# Patient Record
Sex: Female | Born: 1955
Health system: Southern US, Community
[De-identification: ages and names within clinical notes are randomized; demographics above are authoritative.]

---

## 2021-06-08 ENCOUNTER — Other Ambulatory Visit: Payer: Self-pay

## 2021-06-08 ENCOUNTER — Encounter: Payer: Self-pay | Admitting: Internal Medicine

## 2021-06-08 ENCOUNTER — Ambulatory Visit
Admission: RE | Admit: 2021-06-08 | Discharge: 2021-06-08 | Disposition: A | Discharge status: Home / Self Care | Payer: Medicare HMO | Attending: Internal Medicine | Admitting: Internal Medicine

## 2021-06-08 ENCOUNTER — Ambulatory Visit (INDEPENDENT_AMBULATORY_CARE_PROVIDER_SITE_OTHER): Payer: Medicare HMO | Admitting: Internal Medicine

## 2021-06-08 ENCOUNTER — Ambulatory Visit
Admission: RE | Admit: 2021-06-08 | Discharge: 2021-06-08 | Disposition: A | Discharge status: Home / Self Care | Payer: Medicare HMO | Source: Ambulatory Visit | Attending: Internal Medicine | Admitting: Internal Medicine

## 2021-06-08 VITALS — BP 140/88 | HR 65 | Temp 97.8°F | Ht 62.09 in | Wt 165.2 lb

## 2021-06-08 DIAGNOSIS — G8929 Other chronic pain: Secondary | ICD-10-CM

## 2021-06-08 DIAGNOSIS — Z13220 Encounter for screening for lipoid disorders: Secondary | ICD-10-CM | POA: Insufficient documentation

## 2021-06-08 DIAGNOSIS — M5441 Lumbago with sciatica, right side: Secondary | ICD-10-CM

## 2021-06-08 DIAGNOSIS — M5442 Lumbago with sciatica, left side: Secondary | ICD-10-CM | POA: Diagnosis not present

## 2021-06-08 DIAGNOSIS — M545 Low back pain, unspecified: Secondary | ICD-10-CM | POA: Diagnosis not present

## 2021-06-08 DIAGNOSIS — Z87898 Personal history of other specified conditions: Secondary | ICD-10-CM | POA: Diagnosis not present

## 2021-06-08 DIAGNOSIS — M791 Myalgia, unspecified site: Secondary | ICD-10-CM | POA: Diagnosis not present

## 2021-06-08 LAB — MICROSCOPIC EXAMINATION
Bacteria, UA: NONE SEEN
Epithelial Cells (non renal): NONE SEEN /hpf (ref 0–10)
WBC, UA: NONE SEEN /hpf (ref 0–5)

## 2021-06-08 LAB — URINALYSIS, ROUTINE W REFLEX MICROSCOPIC
Bilirubin, UA: NEGATIVE
Glucose, UA: NEGATIVE
Ketones, UA: NEGATIVE
Leukocytes,UA: NEGATIVE
Nitrite, UA: NEGATIVE
Protein,UA: NEGATIVE
Specific Gravity, UA: 1.02 (ref 1.005–1.030)
Urobilinogen, Ur: 0.2 mg/dL (ref 0.2–1.0)
pH, UA: 7 (ref 5.0–7.5)

## 2021-06-08 NOTE — Progress Notes (Signed)
BP 140/88   Pulse 65   Temp 97.8 F (36.6 C) (Oral)   Ht 5' 2.09" (1.577 m)   Wt 165 lb 3.2 oz (74.9 kg)   LMP  (LMP Unknown)   SpO2 97%   BMI 30.13 kg/m    Subjective:    Patient ID: Virginia Cruz, female    DOB: [birth year], 65 y.o.   MRN: [id number]  Chief Complaint  Patient presents with  . New Patient (Initial Visit)    To est. Care.  Patient states that she has had muscle pain for the past 20 years.     HPI: Virginia Cruz is a 65 y.o. female  Pt is here from [place], moved from [place].  Pt has no PMH, has some muscle aches and pain every part of her body per her verbal record x has had this for about 20 years now. Was rx a med cannot remember name.  C/o pain , cannot walk well per her sec to pain per her. No ho smoking. No ho PAD.  Co pain when she walks, not sure if work up was done for pad.   Back Pain This is a chronic (fell down at work a long time ago, used to be a Engineer, civil (consulting) aid.) problem. The problem has been gradually worsening since onset. The pain is present in the lumbar spine. The quality of the pain is described as shooting. Radiates to: right leg worse. Pertinent negatives include no abdominal pain, bladder incontinence, bowel incontinence, chest pain, dysuria, fever, headaches, leg pain, numbness, paresis, paresthesias, pelvic pain, perianal numbness, tingling, weakness or weight loss.   Chief Complaint  Patient presents with  . New Patient (Initial Visit)    To est. Care.  Patient states that she has had muscle pain for the past 20 years.     Relevant past medical, surgical, family and social history reviewed and updated as indicated. Interim medical history since our last visit reviewed. Allergies and medications reviewed and updated.  Review of Systems  Constitutional:  Negative for fever and weight loss.  Cardiovascular:  Negative for chest pain.  Gastrointestinal:  Negative for abdominal pain and bowel incontinence.  Genitourinary:  Negative for  bladder incontinence, dysuria and pelvic pain.  Musculoskeletal:  Positive for back pain.  Neurological:  Negative for tingling, weakness, numbness, headaches and paresthesias.   Per HPI unless specifically indicated above     Objective:    BP 140/88   Pulse 65   Temp 97.8 F (36.6 C) (Oral)   Ht 5' 2.09" (1.577 m)   Wt 165 lb 3.2 oz (74.9 kg)   LMP  (LMP Unknown)   SpO2 97%   BMI 30.13 kg/m   Wt Readings from Last 3 Encounters:  06/08/21 165 lb 3.2 oz (74.9 kg)    Physical Exam Vitals and nursing note reviewed.  Constitutional:      General: She is not in acute distress.    Appearance: Normal appearance. She is not ill-appearing or diaphoretic.  Eyes:     Conjunctiva/sclera: Conjunctivae normal.  Cardiovascular:     Rate and Rhythm: Tachycardia present.     Heart sounds: No murmur heard.   No friction rub. No gallop.  Pulmonary:     Effort: No respiratory distress.     Breath sounds: No stridor. No rhonchi or rales.  Chest:     Chest wall: No tenderness.  Abdominal:     General: Abdomen is flat. Bowel sounds are normal. There is no distension.  Palpations: Abdomen is soft. There is no mass.     Tenderness: There is no abdominal tenderness. There is no guarding.     Hernia: No hernia is present.  Musculoskeletal:        General: No swelling, tenderness, deformity or signs of injury.     Right lower leg: No edema.     Left lower leg: No edema.  Skin:    General: Skin is warm and dry.     Coloration: Skin is not jaundiced or pale.     Findings: No bruising, erythema or lesion.  Neurological:     General: No focal deficit present.     Mental Status: She is alert and oriented to person, place, and time.     Coordination: Coordination normal.     Deep Tendon Reflexes: Reflexes normal.     Comments:  Bil dorsalis pedis pulsations wnl  Diminished post tibial pulsaitons.    No results found for this or any previous visit.     No current outpatient medications  on file.    Assessment & Plan:  Back pain : chronic , abnl gait.  Check L spine xray today , consider MRI of such  Refer to PT   2. Muscle pain ? Intermittent claudications? Will need ABI /  MRA of the lower ext  Chronic pain in lower ext muscles including the calves worse with walking.  Will refer to vascualr surg for such  Check labs today   Problem List Items Addressed This Visit       Nervous and Auditory   Chronic low back pain with bilateral sciatica - Primary   Relevant Orders   DG Lumbar Spine Complete   CBC with Differential/Platelet   Comprehensive metabolic panel   Urinalysis, Routine w reflex microscopic   TSH     Other   H/O pain when walking   Relevant Orders   Ambulatory referral to Vascular Surgery   CBC with Differential/Platelet   Comprehensive metabolic panel   Urinalysis, Routine w reflex microscopic   TSH   Screening for lipid disorders   Relevant Orders   Lipid panel   Other Visit Diagnoses     Generalized muscle ache       Relevant Orders   Ambulatory referral to Rheumatology        Orders Placed This Encounter  Procedures  . DG Lumbar Spine Complete  . CBC with Differential/Platelet  . Comprehensive metabolic panel  . Lipid panel  . Urinalysis, Routine w reflex microscopic  . TSH  . Ambulatory referral to Vascular Surgery  . Ambulatory referral to Rheumatology     No orders of the defined types were placed in this encounter.    Follow up plan: Return in about 2 weeks (around 06/22/2021).

## 2021-06-08 NOTE — Progress Notes (Signed)
Pl let her know this is abnormal and will need further wu with Ortho , will refer her. Thnx.

## 2021-06-09 LAB — CBC WITH DIFFERENTIAL/PLATELET
Basophils Absolute: 0 x10E3/uL (ref 0.0–0.2)
Basos: 1 %
EOS (ABSOLUTE): 0.3 x10E3/uL (ref 0.0–0.4)
Eos: 6 %
Hematocrit: 38.4 % (ref 34.0–46.6)
Hemoglobin: 12.6 g/dL (ref 11.1–15.9)
Immature Grans (Abs): 0 x10E3/uL (ref 0.0–0.1)
Immature Granulocytes: 0 %
Lymphocytes Absolute: 2.2 x10E3/uL (ref 0.7–3.1)
Lymphs: 42 %
MCH: 27.8 pg (ref 26.6–33.0)
MCHC: 32.8 g/dL (ref 31.5–35.7)
MCV: 85 fL (ref 79–97)
Monocytes Absolute: 0.3 x10E3/uL (ref 0.1–0.9)
Monocytes: 6 %
Neutrophils Absolute: 2.4 x10E3/uL (ref 1.4–7.0)
Neutrophils: 45 %
Platelets: 271 x10E3/uL (ref 150–450)
RBC: 4.53 x10E6/uL (ref 3.77–5.28)
RDW: 13.5 % (ref 11.7–15.4)
WBC: 5.3 x10E3/uL (ref 3.4–10.8)

## 2021-06-09 LAB — COMPREHENSIVE METABOLIC PANEL
ALT: 13 IU/L (ref 0–32)
AST: 16 IU/L (ref 0–40)
Albumin/Globulin Ratio: 1.5 (ref 1.2–2.2)
Albumin: 4.3 g/dL (ref 3.8–4.8)
Alkaline Phosphatase: 70 IU/L (ref 44–121)
BUN/Creatinine Ratio: 16 (ref 12–28)
BUN: 13 mg/dL (ref 8–27)
Bilirubin Total: 0.4 mg/dL (ref 0.0–1.2)
CO2: 24 mmol/L (ref 20–29)
Calcium: 9.2 mg/dL (ref 8.7–10.3)
Chloride: 106 mmol/L (ref 96–106)
Creatinine, Ser: 0.79 mg/dL (ref 0.57–1.00)
Globulin, Total: 2.8 g/dL (ref 1.5–4.5)
Glucose: 90 mg/dL (ref 70–99)
Potassium: 4 mmol/L (ref 3.5–5.2)
Sodium: 141 mmol/L (ref 134–144)
Total Protein: 7.1 g/dL (ref 6.0–8.5)
eGFR: 82 mL/min/{1.73_m2} (ref >59)

## 2021-06-09 LAB — TSH: TSH: 0.771 u[IU]/mL (ref 0.450–4.500)

## 2021-06-22 ENCOUNTER — Encounter: Payer: Self-pay | Admitting: Internal Medicine

## 2021-06-22 ENCOUNTER — Ambulatory Visit (INDEPENDENT_AMBULATORY_CARE_PROVIDER_SITE_OTHER): Payer: Medicare HMO | Admitting: Internal Medicine

## 2021-06-22 DIAGNOSIS — G8929 Other chronic pain: Secondary | ICD-10-CM

## 2021-06-22 DIAGNOSIS — M5441 Lumbago with sciatica, right side: Secondary | ICD-10-CM | POA: Diagnosis not present

## 2021-06-22 MED ORDER — GABAPENTIN 100 MG PO CAPS: 100.0000 mg | ORAL_CAPSULE | Freq: Every day | ORAL | 3 refills | Status: DC

## 2021-06-22 MED ORDER — METHYLPREDNISOLONE 4 MG PO TBPK: ORAL_TABLET | ORAL | 1 refills | Status: DC

## 2021-06-22 NOTE — Progress Notes (Addendum)
LMP  (LMP Unknown)    Subjective:    Patient ID: Virginia Cruz, female    DOB: Feb 21, 1956, 65 y.o.   MRN: [id number]  No chief complaint on file.   HPI: Virginia Cruz is a 65 y.o. female   This visit was completed via telephone due to the restrictions of the COVID-19 pandemic. All issues as above were discussed and addressed but no physical exam was performed. If it was felt that the patient should be evaluated in the office, they were directed there. The patient verbally consented to this visit. Patient was unable to complete an audio/visual visit due to Technical difficulties. Due to the catastrophic nature of the COVID-19 pandemic, this visit was done through audio contact only. Location of the patient: home Location of the provider: work Those involved with this call:  Provider: Loura Pardon, MD CMA: Tristan Schroeder, CMA Front Desk/Registration: [employer]  Time spent on call: 10 minutes on the phone discussing health concerns. 1 minutes total spent in review of patient's record and preparation of their chart.    Back Pain This is a chronic (back pain and muscle spasms worsening, cannot walk far distance per her verbal record.) problem. The current episode started more than 1 month ago (pain radiates down her lower ext right one has sciatica , feels like an electric shock). The problem occurs intermittently. The problem has been waxing and waning since onset. The pain is present in the lumbar spine. The quality of the pain is described as stabbing and shooting. The pain is at a severity of 6/10. The pain is moderate. Associated symptoms include leg pain. Pertinent negatives include no abdominal pain, bladder incontinence, bowel incontinence, chest pain, dysuria, fever, numbness, paresis, paresthesias, pelvic pain, perianal numbness, tingling or weakness. She has tried NSAIDs for the symptoms.   No chief complaint on file.   Relevant past medical, surgical, family and social history  reviewed and updated as indicated. Interim medical history since our last visit reviewed. Allergies and medications reviewed and updated.  Review of Systems  Constitutional:  Negative for fever.  Cardiovascular:  Negative for chest pain.  Gastrointestinal:  Negative for abdominal pain and bowel incontinence.  Genitourinary:  Negative for bladder incontinence, dysuria and pelvic pain.  Musculoskeletal:  Positive for back pain.  Neurological:  Negative for tingling, weakness, numbness and paresthesias.   Per HPI unless specifically indicated above     Objective:    LMP  (LMP Unknown)   Wt Readings from Last 3 Encounters:  06/08/21 165 lb 3.2 oz (74.9 kg)    Physical Exam  Unable to peform sec to virtual visit.      Assessment & Plan:  Back pain will start pt on neurontin for suhc  Start pt on medrol dose pak for acute relief of pain Refer to ortho for further mx and eval.  Follow up plan: No follow-ups on file.   This visit was completed via telephone due to the restrictions of the COVID-19 pandemic. All issues as above were discussed and addressed but no physical exam was performed. If it was felt that the patient should be evaluated in the office, they were directed there. The patient verbally consented to this visit. Patient was unable to complete an audio/visual visit due to Technical difficulties", "Lack of internet. Due to the catastrophic nature of the COVID-19 pandemic, this visit was done through audio contact only. Location of the patient: home Location of the provider: work Those involved with this call:  Provider: Loura Pardon, MD CMA: Tristan Schroeder, CMA Front Desk/Registration: [employer]  Time spent on call:  10 minutes on the phone discussing health concerns. 10 minutes total spent in review of patient's record and preparation of their chart.

## 2021-06-22 NOTE — Patient Instructions (Signed)
Back Exercises °These exercises help to make your trunk and back strong. They also help to keep the lower back flexible. Doing these exercises can help to prevent or lessen pain in your lower back. °If you have back pain, try to do these exercises 2-3 times each day or as told by your doctor. °As you get better, do the exercises once each day. Repeat the exercises more often as told by your doctor. °To stop back pain from coming back, do the exercises once each day, or as told by your doctor. °Do exercises exactly as told by your doctor. Stop right away if you feel sudden pain or your pain gets worse. °Exercises °Single knee to chest °Do these steps 3-5 times in a row for each leg: °Lie on your back on a firm bed or the floor with your legs stretched out. °Bring one knee to your chest. °Grab your knee or thigh with both hands and hold it in place. °Pull on your knee until you feel a gentle stretch in your lower back or butt. °Keep doing the stretch for 10-30 seconds. °Slowly let go of your leg and straighten it. °Pelvic tilt °Do these steps 5-10 times in a row: °Lie on your back on a firm bed or the floor with your legs stretched out. °Bend your knees so they point up to the ceiling. Your feet should be flat on the floor. °Tighten your lower belly (abdomen) muscles to press your lower back against the floor. This will make your tailbone point up to the ceiling instead of pointing down to your feet or the floor. °Stay in this position for 5-10 seconds while you gently tighten your muscles and breathe evenly. °Cat-cow °Do these steps until your lower back bends more easily: °Get on your hands and knees on a firm bed or the floor. Keep your hands under your shoulders, and keep your knees under your hips. You may put padding under your knees. °Let your head hang down toward your chest. Tighten (contract) the muscles in your belly. Point your tailbone toward the floor so your lower back becomes rounded like the back of a  cat. °Stay in this position for 5 seconds. °Slowly lift your head. Let the muscles of your belly relax. Point your tailbone up toward the ceiling so your back forms a sagging arch like the back of a cow. °Stay in this position for 5 seconds. ° °Press-ups °Do these steps 5-10 times in a row: °Lie on your belly (face-down) on a firm bed or the floor. °Place your hands near your head, about shoulder-width apart. °While you keep your back relaxed and keep your hips on the floor, slowly straighten your arms to raise the top half of your body and lift your shoulders. Do not use your back muscles. You may change where you place your hands to make yourself more comfortable. °Stay in this position for 5 seconds. Keep your back relaxed. °Slowly return to lying flat on the floor. ° °Bridges °Do these steps 10 times in a row: °Lie on your back on a firm bed or the floor. °Bend your knees so they point up to the ceiling. Your feet should be flat on the floor. Your arms should be flat at your sides, next to your body. °Tighten your butt muscles and lift your butt off the floor until your waist is almost as high as your knees. If you do not feel the muscles working in your butt and the back of   your thighs, slide your feet 1-2 inches (2.5-5 cm) farther away from your butt. °Stay in this position for 3-5 seconds. °Slowly lower your butt to the floor, and let your butt muscles relax. °If this exercise is too easy, try doing it with your arms crossed over your chest. °Belly crunches °Do these steps 5-10 times in a row: °Lie on your back on a firm bed or the floor with your legs stretched out. °Bend your knees so they point up to the ceiling. Your feet should be flat on the floor. °Cross your arms over your chest. °Tip your chin a little bit toward your chest, but do not bend your neck. °Tighten your belly muscles and slowly raise your chest just enough to lift your shoulder blades a tiny bit off the floor. Avoid raising your body  higher than that because it can put too much stress on your lower back. °Slowly lower your chest and your head to the floor. °Back lifts °Do these steps 5-10 times in a row: °Lie on your belly (face-down) with your arms at your sides, and rest your forehead on the floor. °Tighten the muscles in your legs and your butt. °Slowly lift your chest off the floor while you keep your hips on the floor. Keep the back of your head in line with the curve in your back. Look at the floor while you do this. °Stay in this position for 3-5 seconds. °Slowly lower your chest and your face to the floor. °Contact a doctor if: °Your back pain gets a lot worse when you do an exercise. °Your back pain does not get better within 2 hours after you exercise. °If you have any of these problems, stop doing the exercises. Do not do them again unless your doctor says it is okay. °Get help right away if: °You have sudden, very bad back pain. If this happens, stop doing the exercises. Do not do them again unless your doctor says it is okay. °This information is not intended to replace advice given to you by your health care provider. Make sure you discuss any questions you have with your health care provider. °Document Revised: 09/17/2020 Document Reviewed: 09/17/2020 °[employer] Patient Education © 2022 [employer] ° °

## 2021-07-30 ENCOUNTER — Ambulatory Visit (INDEPENDENT_AMBULATORY_CARE_PROVIDER_SITE_OTHER): Payer: Medicare HMO | Admitting: Internal Medicine

## 2021-07-30 ENCOUNTER — Encounter: Payer: Self-pay | Admitting: Internal Medicine

## 2021-07-30 ENCOUNTER — Other Ambulatory Visit: Payer: Self-pay

## 2021-07-30 ENCOUNTER — Ambulatory Visit (INDEPENDENT_AMBULATORY_CARE_PROVIDER_SITE_OTHER): Payer: Medicare HMO | Admitting: *Deleted

## 2021-07-30 VITALS — BP 146/78 | HR 75 | Temp 98.0°F | Ht 62.09 in | Wt 164.8 lb

## 2021-07-30 DIAGNOSIS — M5440 Lumbago with sciatica, unspecified side: Secondary | ICD-10-CM | POA: Diagnosis not present

## 2021-07-30 DIAGNOSIS — Z1231 Encounter for screening mammogram for malignant neoplasm of breast: Secondary | ICD-10-CM

## 2021-07-30 DIAGNOSIS — Z780 Asymptomatic menopausal state: Secondary | ICD-10-CM

## 2021-07-30 DIAGNOSIS — M79604 Pain in right leg: Secondary | ICD-10-CM

## 2021-07-30 DIAGNOSIS — Z0000 Encounter for general adult medical examination without abnormal findings: Secondary | ICD-10-CM

## 2021-07-30 DIAGNOSIS — M546 Pain in thoracic spine: Secondary | ICD-10-CM | POA: Insufficient documentation

## 2021-07-30 MED ORDER — CYCLOBENZAPRINE HCL 5 MG PO TABS: 5.0000 mg | ORAL_TABLET | Freq: Every day | ORAL | 0 refills | Status: AC

## 2021-07-30 MED ORDER — GABAPENTIN 100 MG PO CAPS: 100.0000 mg | ORAL_CAPSULE | Freq: Every day | ORAL | 3 refills | Status: AC

## 2021-07-30 NOTE — Patient Instructions (Signed)
Back Exercises °These exercises help to make your trunk and back strong. They also help to keep the lower back flexible. Doing these exercises can help to prevent or lessen pain in your lower back. °If you have back pain, try to do these exercises 2-3 times each day or as told by your doctor. °As you get better, do the exercises once each day. Repeat the exercises more often as told by your doctor. °To stop back pain from coming back, do the exercises once each day, or as told by your doctor. °Do exercises exactly as told by your doctor. Stop right away if you feel sudden pain or your pain gets worse. °Exercises °Single knee to chest °Do these steps 3-5 times in a row for each leg: °Lie on your back on a firm bed or the floor with your legs stretched out. °Bring one knee to your chest. °Grab your knee or thigh with both hands and hold it in place. °Pull on your knee until you feel a gentle stretch in your lower back or butt. °Keep doing the stretch for 10-30 seconds. °Slowly let go of your leg and straighten it. °Pelvic tilt °Do these steps 5-10 times in a row: °Lie on your back on a firm bed or the floor with your legs stretched out. °Bend your knees so they point up to the ceiling. Your feet should be flat on the floor. °Tighten your lower belly (abdomen) muscles to press your lower back against the floor. This will make your tailbone point up to the ceiling instead of pointing down to your feet or the floor. °Stay in this position for 5-10 seconds while you gently tighten your muscles and breathe evenly. °Cat-cow °Do these steps until your lower back bends more easily: °Get on your hands and knees on a firm bed or the floor. Keep your hands under your shoulders, and keep your knees under your hips. You may put padding under your knees. °Let your head hang down toward your chest. Tighten (contract) the muscles in your belly. Point your tailbone toward the floor so your lower back becomes rounded like the back of a  cat. °Stay in this position for 5 seconds. °Slowly lift your head. Let the muscles of your belly relax. Point your tailbone up toward the ceiling so your back forms a sagging arch like the back of a cow. °Stay in this position for 5 seconds. ° °Press-ups °Do these steps 5-10 times in a row: °Lie on your belly (face-down) on a firm bed or the floor. °Place your hands near your head, about shoulder-width apart. °While you keep your back relaxed and keep your hips on the floor, slowly straighten your arms to raise the top half of your body and lift your shoulders. Do not use your back muscles. You may change where you place your hands to make yourself more comfortable. °Stay in this position for 5 seconds. Keep your back relaxed. °Slowly return to lying flat on the floor. ° °Bridges °Do these steps 10 times in a row: °Lie on your back on a firm bed or the floor. °Bend your knees so they point up to the ceiling. Your feet should be flat on the floor. Your arms should be flat at your sides, next to your body. °Tighten your butt muscles and lift your butt off the floor until your waist is almost as high as your knees. If you do not feel the muscles working in your butt and the back of   your thighs, slide your feet 1-2 inches (2.5-5 cm) farther away from your butt. °Stay in this position for 3-5 seconds. °Slowly lower your butt to the floor, and let your butt muscles relax. °If this exercise is too easy, try doing it with your arms crossed over your chest. °Belly crunches °Do these steps 5-10 times in a row: °Lie on your back on a firm bed or the floor with your legs stretched out. °Bend your knees so they point up to the ceiling. Your feet should be flat on the floor. °Cross your arms over your chest. °Tip your chin a little bit toward your chest, but do not bend your neck. °Tighten your belly muscles and slowly raise your chest just enough to lift your shoulder blades a tiny bit off the floor. Avoid raising your body  higher than that because it can put too much stress on your lower back. °Slowly lower your chest and your head to the floor. °Back lifts °Do these steps 5-10 times in a row: °Lie on your belly (face-down) with your arms at your sides, and rest your forehead on the floor. °Tighten the muscles in your legs and your butt. °Slowly lift your chest off the floor while you keep your hips on the floor. Keep the back of your head in line with the curve in your back. Look at the floor while you do this. °Stay in this position for 3-5 seconds. °Slowly lower your chest and your face to the floor. °Contact a doctor if: °Your back pain gets a lot worse when you do an exercise. °Your back pain does not get better within 2 hours after you exercise. °If you have any of these problems, stop doing the exercises. Do not do them again unless your doctor says it is okay. °Get help right away if: °You have sudden, very bad back pain. If this happens, stop doing the exercises. Do not do them again unless your doctor says it is okay. °This information is not intended to replace advice given to you by your health care provider. Make sure you discuss any questions you have with your health care provider. °Document Revised: 09/17/2020 Document Reviewed: 09/17/2020 °[employer] Patient Education © 2022 [employer] ° °

## 2021-07-30 NOTE — Progress Notes (Signed)
Subjective:   Virginia Cruz is a 66 y.o. female who presents for Medicare Annual (Subsequent) preventive examination.  I connected with  Virginia Cruz on 07/30/21 by a telephone enabled telemedicine application and verified that I am speaking with the correct person using two identifiers.   I discussed the limitations of evaluation and management by telemedicine. The patient expressed understanding and agreed to proceed.  Patient location: home  Provider location: Tele-Health  not in office    .   Review of Systems     Cardiac Risk Factors include: advanced age (>14men, >78 women)     Objective:    Today's Vitals   07/30/21 1032  PainSc: 5    There is no height or weight on file to calculate BMI.  Advanced Directives 07/30/2021  Does Patient Have a Medical Advance Directive? No  Would patient like information on creating a medical advance directive? No - Patient declined    Current Medications (verified) Outpatient Encounter Medications as of 07/30/2021  Medication Sig   gabapentin (NEURONTIN) 100 MG capsule Take 1 capsule (100 mg total) by mouth at bedtime.   methylPREDNISolone (MEDROL DOSEPAK) 4 MG TBPK tablet Use as directed (Patient not taking: Reported on 07/30/2021)   No facility-administered encounter medications on file as of 07/30/2021.    Allergies (verified) Patient has no known allergies.   History: History reviewed. No pertinent past medical history. History reviewed. No pertinent surgical history. No family history on file. Social History   Socioeconomic History   Marital status: Legally Separated    Spouse name: Not on file   Number of children: Not on file   Years of education: Not on file   Highest education level: Not on file  Occupational History   Not on file  Tobacco Use   Smoking status: Never   Smokeless tobacco: Never  Vaping Use   Vaping Use: Never used  Substance and Sexual Activity   Alcohol use: Never   Drug use: Never    Sexual activity: Not Currently  Other Topics Concern   Not on file  Social History Narrative   Not on file   Social Determinants of Health   Financial Resource Strain: Low Risk    Difficulty of Paying Living Expenses: Not hard at all  Food Insecurity: No Food Insecurity   Worried About Programme researcher, broadcasting/film/video in the Last Year: Never true   Ran Out of Food in the Last Year: Never true  Transportation Needs: No Transportation Needs   Lack of Transportation (Medical): No   Lack of Transportation (Non-Medical): No  Physical Activity: Inactive   Days of Exercise per Week: 0 days   Minutes of Exercise per Session: 0 min  Stress: No Stress Concern Present   Feeling of Stress : Only a little  Social Connections: Moderately Integrated   Frequency of Communication with Friends and Family: Twice a week   Frequency of Social Gatherings with Friends and Family: Twice a week   Attends Religious Services: More than 4 times per year   Active Member of [employer] or Organizations: No   Attends Engineer, structural: Never   Marital Status: Married    Tobacco Counseling Counseling given: Not Answered   Clinical Intake:  Pre-visit preparation completed: Yes  Pain : 0-10 Pain Score: 5  Pain Location: Leg Pain Descriptors / Indicators: Constant, Burning, Aching Pain Onset: 1 to 4 weeks ago Pain Frequency: Constant Pain Relieving Factors: gabepentin  Pain Relieving Factors: gabepentin  Nutritional  Risks: None Diabetes: No  How often do you need to have someone help you when you read instructions, pamphlets, or other written materials from your doctor or pharmacy?: 1 - Never  Diabetic?  NO  Interpreter Needed?: No  Information entered by :: Remi HaggardJulie Hanae Waiters LPN   Activities of Daily Living In your present state of health, do you have any difficulty performing the following activities: 07/30/2021  Hearing? N  Vision? N  Difficulty concentrating or making decisions? N  Walking or  climbing stairs? Y  Comment only when has pain in legs  Dressing or bathing? N  Doing errands, shopping? N  Preparing Food and eating ? N  Using the Toilet? N  In the past six months, have you accidently leaked urine? N  Do you have problems with loss of bowel control? N  Managing your Medications? N  Managing your Finances? N  Housekeeping or managing your Housekeeping? N    Patient Care Team: Loura PardonVigg, Avanti, MD as PCP - General (Internal Medicine)  Indicate any recent Medical Services you may have received from other than [facility] providers in the past year (date may be approximate).     Assessment:   This is a routine wellness examination for Virginia Cruz.  Hearing/Vision screen Hearing Screening - Comments:: No trouble hearing Vision Screening - Comments:: Not up to  update  Dietary issues and exercise activities discussed: Current Exercise Habits: The patient does not participate in regular exercise at present, Exercise limited by: orthopedic condition(s)   Goals Addressed   None    Depression Screen PHQ 2/9 Scores 07/30/2021  PHQ - 2 Score 0    Fall Risk Fall Risk  07/30/2021  Falls in the past year? 0  Number falls in past yr: 0  Injury with Fall? 0  Follow up Falls evaluation completed;Falls prevention discussed    FALL RISK PREVENTION PERTAINING TO THE HOME:  Any stairs in or around the home? No  If so, are there any without handrails? No  Home free of loose throw rugs in walkways, pet beds, electrical cords, etc? Yes  Adequate lighting in your home to reduce risk of falls? Yes   ASSISTIVE DEVICES UTILIZED TO PREVENT FALLS:  Life alert? No  Use of a cane, walker or w/c? No  Grab bars in the bathroom? No  Shower chair or bench in shower? Yes  Elevated toilet seat or a handicapped toilet? Yes   TIMED UP AND GO:  Was the test performed? No .    Cognitive Function:  Normal cognitive status assessed by direct observation by this Nurse Health Advisor. No  abnormalities found.          Immunizations  There is no immunization history on file for this patient.  Tdap   patient unsure moved from [place]  said records were being sent  Flu Vaccine status: Due, Education has been provided regarding the importance of this vaccine. Advised may receive this vaccine at local pharmacy or Health Dept. Aware to provide a copy of the vaccination record if obtained from local pharmacy or Health Dept. Verbalized acceptance and understanding.  Pneumococcal  patient states she has records transferred from [place]    Covid-19 vaccine status: Information provided on how to obtain vaccines.   Qualifies for Shingles Vaccine? Yes   Zostavax completed No   Shingrix Completed?: No.    Education has been provided regarding the importance of this vaccine. Patient has been advised to call insurance company to determine out of pocket  expense if they have not yet received this vaccine. Advised may also receive vaccine at local pharmacy or Health Dept. Verbalized acceptance and understanding.  Screening Tests Health Maintenance  Topic Date Due   COVID-19 Vaccine (1) Never done   Hepatitis C Screening  Never done   TETANUS/TDAP  Never done   COLONOSCOPY (Pts 45-84yrs Insurance coverage will need to be confirmed)  Never done   MAMMOGRAM  Never done   Zoster Vaccines- Shingrix (1 of 2) Never done   Pneumonia Vaccine 59+ Years old (1 - PCV) Never done   DEXA SCAN  Never done   INFLUENZA VACCINE  Never done   HPV VACCINES  Aged Out    Health Maintenance  Health Maintenance Due  Topic Date Due   COVID-19 Vaccine (1) Never done   Hepatitis C Screening  Never done   TETANUS/TDAP  Never done   COLONOSCOPY (Pts 45-94yrs Insurance coverage will need to be confirmed)  Never done   MAMMOGRAM  Never done   Zoster Vaccines- Shingrix (1 of 2) Never done   Pneumonia Vaccine 65+ Years old (1 - PCV) Never done   DEXA SCAN  Never done   INFLUENZA VACCINE  Never done     Colonoscopy declined  Mammogram status: Ordered  . Pt provided with contact info and advised to call to schedule appt.   Bone Density status: Ordered  . Pt provided with contact info and advised to call to schedule appt.  Lung Cancer Screening: (Low Dose CT Chest recommended if Age 47-80 years, 30 pack-year currently smoking OR have quit w/in 15years.) does not qualify.   Lung Cancer Screening Referral:   Additional Screening:  Hepatitis C Screening: does not qualify;   Vision Screening: Recommended annual ophthalmology exams for early detection of glaucoma and other disorders of the eye. Is the patient up to date with their annual eye exam?  No  Who is the provider or what is the name of the office in which the patient attends annual eye exams?  If pt is not established with a provider, would they like to be referred to a provider to establish care? No .   Dental Screening: Recommended annual dental exams for proper oral hygiene  Community Resource Referral / Chronic Care Management: CRR required this visit?  No   CCM required this visit?  No      Plan:     I have personally reviewed and noted the following in the patients chart:   Medical and social history Use of alcohol, tobacco or illicit drugs  Current medications and supplements including opioid prescriptions.  Functional ability and status Nutritional status Physical activity Advanced directives List of other physicians Hospitalizations, surgeries, and ER visits in previous 12 months Vitals Screenings to include cognitive, depression, and falls Referrals and appointments  In addition, I have reviewed and discussed with patient certain preventive protocols, quality metrics, and best practice recommendations. A written personalized care plan for preventive services as well as general preventive health recommendations were provided to patient.     Remi Haggard, LPN   11/02/3843   Nurse Notes:  patient  stated she is having leg pain (muscle)  and wanted muscle relaxer, patient will call to speak with doctor did not want to schedule until she called office.

## 2021-07-30 NOTE — Patient Instructions (Signed)
Virginia Cruz , Thank you for taking time to come for your Medicare Wellness Visit. I appreciate your ongoing commitment to your health goals. Please review the following plan we discussed and let me know if I can assist you in the future.   Screening recommendations/referrals: Colonoscopy: Education provided Mammogram: Education provided Bone Density: Education provided Recommended yearly ophthalmology/optometry visit for glaucoma screening and checkup Recommended yearly dental visit for hygiene and checkup  Vaccinations: Influenza vaccine: Education provided Pneumococcal vaccine: Education provided Tdap vaccine: Education provided Shingles vaccine: Education provided    Advanced directives:   Conditions/risks identified:      Preventive Care 65 Years and Older, Female Preventive care refers to lifestyle choices and visits with your health care provider that can promote health and wellness. What does preventive care include? A yearly physical exam. This is also called an annual well check. Dental exams once or twice a year. Routine eye exams. Ask your health care provider how often you should have your eyes checked. Personal lifestyle choices, including: Daily care of your teeth and gums. Regular physical activity. Eating a healthy diet. Avoiding tobacco and drug use. Limiting alcohol use. Practicing safe sex. Taking low-dose aspirin every day. Taking vitamin and mineral supplements as recommended by your health care provider. What happens during an annual well check? The services and screenings done by your health care provider during your annual well check will depend on your age, overall health, lifestyle risk factors, and family history of disease. Counseling  Your health care provider may ask you questions about your: Alcohol use. Tobacco use. Drug use. Emotional well-being. Home and relationship well-being. Sexual activity. Eating habits. History of falls. Memory  and ability to understand (cognition). Work and work Astronomer. Reproductive health. Screening  You may have the following tests or measurements: Height, weight, and BMI. Blood pressure. Lipid and cholesterol levels. These may be checked every 5 years, or more frequently if you are over 67 years old. Skin check. Lung cancer screening. You may have this screening every year starting at age 60 if you have a 30-pack-year history of smoking and currently smoke or have quit within the past 15 years. Fecal occult blood test (FOBT) of the stool. You may have this test every year starting at age 39. Flexible sigmoidoscopy or colonoscopy. You may have a sigmoidoscopy every 5 years or a colonoscopy every 10 years starting at age 47. Hepatitis C blood test. Hepatitis B blood test. Sexually transmitted disease (STD) testing. Diabetes screening. This is done by checking your blood sugar (glucose) after you have not eaten for a while (fasting). You may have this done every 1-3 years. Bone density scan. This is done to screen for osteoporosis. You may have this done starting at age 70. Mammogram. This may be done every 1-2 years. Talk to your health care provider about how often you should have regular mammograms. Talk with your health care provider about your test results, treatment options, and if necessary, the need for more tests. Vaccines  Your health care provider may recommend certain vaccines, such as: Influenza vaccine. This is recommended every year. Tetanus, diphtheria, and acellular pertussis (Tdap, Td) vaccine. You may need a Td booster every 10 years. Zoster vaccine. You may need this after age 76. Pneumococcal 13-valent conjugate (PCV13) vaccine. One dose is recommended after age 64. Pneumococcal polysaccharide (PPSV23) vaccine. One dose is recommended after age 21. Talk to your health care provider about which screenings and vaccines you need and how often you need  them. This  information is not intended to replace advice given to you by your health care provider. Make sure you discuss any questions you have with your health care provider. Document Released: 08/01/2015 Document Revised: 03/24/2016 Document Reviewed: 05/06/2015 Elsevier Interactive Patient Education  2017 [place] Prevention in the Home Falls can cause injuries. They can happen to people of all ages. There are many things you can do to make your home safe and to help prevent falls. What can I do on the outside of my home? Regularly fix the edges of walkways and driveways and fix any cracks. Remove anything that might make you trip as you walk through a door, such as a raised step or threshold. Trim any bushes or trees on the path to your home. Use bright outdoor lighting. Clear any walking paths of anything that might make someone trip, such as rocks or tools. Regularly check to see if handrails are loose or broken. Make sure that both sides of any steps have handrails. Any raised decks and porches should have guardrails on the edges. Have any leaves, snow, or ice cleared regularly. Use sand or salt on walking paths during winter. Clean up any spills in your garage right away. This includes oil or grease spills. What can I do in the bathroom? Use night lights. Install grab bars by the toilet and in the tub and shower. Do not use towel bars as grab bars. Use non-skid mats or decals in the tub or shower. If you need to sit down in the shower, use a plastic, non-slip stool. Keep the floor dry. Clean up any water that spills on the floor as soon as it happens. Remove soap buildup in the tub or shower regularly. Attach bath mats securely with double-sided non-slip rug tape. Do not have throw rugs and other things on the floor that can make you trip. What can I do in the bedroom? Use night lights. Make sure that you have a light by your bed that is easy to reach. Do not use any sheets or  blankets that are too big for your bed. They should not hang down onto the floor. Have a firm chair that has side arms. You can use this for support while you get dressed. Do not have throw rugs and other things on the floor that can make you trip. What can I do in the kitchen? Clean up any spills right away. Avoid walking on wet floors. Keep items that you use a lot in easy-to-reach places. If you need to reach something above you, use a strong step stool that has a grab bar. Keep electrical cords out of the way. Do not use floor polish or wax that makes floors slippery. If you must use wax, use non-skid floor wax. Do not have throw rugs and other things on the floor that can make you trip. What can I do with my stairs? Do not leave any items on the stairs. Make sure that there are handrails on both sides of the stairs and use them. Fix handrails that are broken or loose. Make sure that handrails are as long as the stairways. Check any carpeting to make sure that it is firmly attached to the stairs. Fix any carpet that is loose or worn. Avoid having throw rugs at the top or bottom of the stairs. If you do have throw rugs, attach them to the floor with carpet tape. Make sure that you have a light switch at  the top of the stairs and the bottom of the stairs. If you do not have them, ask someone to add them for you. What else can I do to help prevent falls? Wear shoes that: Do not have high heels. Have rubber bottoms. Are comfortable and fit you well. Are closed at the toe. Do not wear sandals. If you use a stepladder: Make sure that it is fully opened. Do not climb a closed stepladder. Make sure that both sides of the stepladder are locked into place. Ask someone to hold it for you, if possible. Clearly mark and make sure that you can see: Any grab bars or handrails. First and last steps. Where the edge of each step is. Use tools that help you move around (mobility aids) if they are  needed. These include: Canes. Walkers. Scooters. Crutches. Turn on the lights when you go into a dark area. Replace any light bulbs as soon as they burn out. Set up your furniture so you have a clear path. Avoid moving your furniture around. If any of your floors are uneven, fix them. If there are any pets around you, be aware of where they are. Review your medicines with your doctor. Some medicines can make you feel dizzy. This can increase your chance of falling. Ask your doctor what other things that you can do to help prevent falls. This information is not intended to replace advice given to you by your health care provider. Make sure you discuss any questions you have with your health care provider. Document Released: 05/01/2009 Document Revised: 12/11/2015 Document Reviewed: 08/09/2014 Elsevier Interactive Patient Education  2017 [employer].

## 2021-07-30 NOTE — Progress Notes (Signed)
BP (!) 146/78    Pulse 75    Temp 98 F (36.7 C) (Oral)    Ht 5' 2.09" (1.577 m)    Wt 164 lb 12.8 oz (74.8 kg)    LMP  (LMP Unknown)    SpO2 98%    BMI 30.06 kg/m    Subjective:    Patient ID: Virginia Cruz, female    DOB: Aug 11, 1955, 66 y.o.   MRN: [id number]  Chief Complaint  Patient presents with   Muscle Pain    All around her butt     HPI: Virginia Cruz is a 66 y.o. female  Muscle Pain This is a new (has had ms spasm - started x all over her body) problem. The current episode started more than 1 year ago. The problem occurs intermittently. The problem has been waxing and waning since onset. The pain is mild. Associated symptoms include stiffness and sensory change. Pertinent negatives include no dysuria, eye pain, fatigue, fever, headaches, joint swelling, nausea, rash, shortness of breath, swollen glands, urinary symptoms, vaginal discharge, visual change, vomiting, weakness or wheezing.  Back Pain This is a chronic problem. The current episode started 1 to 4 weeks ago. The problem occurs intermittently. The pain is present in the lumbar spine and thoracic spine. The quality of the pain is described as shooting. Associated symptoms include leg pain. Pertinent negatives include no dysuria, fever, headaches, numbness, tingling or weakness.  Leg Pain  The incident occurred more than 1 week ago. The pain is present in the right leg. The quality of the pain is described as shooting. The pain is at a severity of 2/10. The pain has been Fluctuating since onset. Associated symptoms include an inability to bear weight. Pertinent negatives include no loss of motion, loss of sensation, muscle weakness, numbness or tingling.   Chief Complaint  Patient presents with   Muscle Pain    All around her butt     Relevant past medical, surgical, family and social history reviewed and updated as indicated. Interim medical history since our last visit reviewed. Allergies and medications reviewed  and updated.  Review of Systems  Constitutional:  Negative for fatigue and fever.  Eyes:  Negative for pain.  Respiratory:  Negative for shortness of breath and wheezing.   Gastrointestinal:  Negative for nausea and vomiting.  Genitourinary:  Negative for dysuria and vaginal discharge.  Musculoskeletal:  Positive for back pain and stiffness. Negative for joint swelling.  Skin:  Negative for rash.  Neurological:  Positive for sensory change. Negative for tingling, weakness, numbness and headaches.   Per HPI unless specifically indicated above     Objective:    BP (!) 146/78    Pulse 75    Temp 98 F (36.7 C) (Oral)    Ht 5' 2.09" (1.577 m)    Wt 164 lb 12.8 oz (74.8 kg)    LMP  (LMP Unknown)    SpO2 98%    BMI 30.06 kg/m   Wt Readings from Last 3 Encounters:  07/30/21 164 lb 12.8 oz (74.8 kg)  06/08/21 165 lb 3.2 oz (74.9 kg)    Physical Exam Vitals and nursing note reviewed.  Constitutional:      General: She is not in acute distress.    Appearance: Normal appearance. She is not ill-appearing or diaphoretic.  Eyes:     Conjunctiva/sclera: Conjunctivae normal.  Pulmonary:     Breath sounds: No rhonchi.  Abdominal:     General: Abdomen is flat.  Bowel sounds are normal. There is no distension.     Palpations: Abdomen is soft. There is no mass.     Tenderness: There is no abdominal tenderness. There is no guarding.  Musculoskeletal:        General: Tenderness present. No swelling, deformity or signs of injury.     Right lower leg: No edema.     Left lower leg: No edema.     Comments: Lower ext pain and tenderness to touch on the lower back   Skin:    General: Skin is warm and dry.     Coloration: Skin is not jaundiced.     Findings: No erythema.  Neurological:     Mental Status: She is alert.    Results for orders placed or performed in visit on 06/08/21  Microscopic Examination   Urine  Result Value Ref Range   WBC, UA None seen 0 - 5 /hpf   RBC 0-2 0 - 2 /hpf    Epithelial Cells (non renal) None seen 0 - 10 /hpf   Bacteria, UA None seen None seen/Few  CBC with Differential/Platelet  Result Value Ref Range   WBC 5.3 3.4 - 10.8 x10E3/uL   RBC 4.53 3.77 - 5.28 x10E6/uL   Hemoglobin 12.6 11.1 - 15.9 g/dL   Hematocrit 38.4 34.0 - 46.6 %   MCV 85 79 - 97 fL   MCH 27.8 26.6 - 33.0 pg   MCHC 32.8 31.5 - 35.7 g/dL   RDW 13.5 11.7 - 15.4 %   Platelets 271 150 - 450 x10E3/uL   Neutrophils 45 Not Estab. %   Lymphs 42 Not Estab. %   Monocytes 6 Not Estab. %   Eos 6 Not Estab. %   Basos 1 Not Estab. %   Neutrophils Absolute 2.4 1.4 - 7.0 x10E3/uL   Lymphocytes Absolute 2.2 0.7 - 3.1 x10E3/uL   Monocytes Absolute 0.3 0.1 - 0.9 x10E3/uL   EOS (ABSOLUTE) 0.3 0.0 - 0.4 x10E3/uL   Basophils Absolute 0.0 0.0 - 0.2 x10E3/uL   Immature Granulocytes 0 Not Estab. %   Immature Grans (Abs) 0.0 0.0 - 0.1 x10E3/uL  Comprehensive metabolic panel  Result Value Ref Range   Glucose 90 70 - 99 mg/dL   BUN 13 8 - 27 mg/dL   Creatinine, Ser 0.79 0.57 - 1.00 mg/dL   eGFR 82 >59 mL/min/1.73   BUN/Creatinine Ratio 16 12 - 28   Sodium 141 134 - 144 mmol/L   Potassium 4.0 3.5 - 5.2 mmol/L   Chloride 106 96 - 106 mmol/L   CO2 24 20 - 29 mmol/L   Calcium 9.2 8.7 - 10.3 mg/dL   Total Protein 7.1 6.0 - 8.5 g/dL   Albumin 4.3 3.8 - 4.8 g/dL   Globulin, Total 2.8 1.5 - 4.5 g/dL   Albumin/Globulin Ratio 1.5 1.2 - 2.2   Bilirubin Total 0.4 0.0 - 1.2 mg/dL   Alkaline Phosphatase 70 44 - 121 IU/L   AST 16 0 - 40 IU/L   ALT 13 0 - 32 IU/L  Urinalysis, Routine w reflex microscopic  Result Value Ref Range   Specific Gravity, UA 1.020 1.005 - 1.030   pH, UA 7.0 5.0 - 7.5   Color, UA Yellow Yellow   Appearance Ur Clear Clear   Leukocytes,UA Negative Negative   Protein,UA Negative Negative/Trace   Glucose, UA Negative Negative   Ketones, UA Negative Negative   RBC, UA Trace (A) Negative   Bilirubin, UA Negative Negative  Urobilinogen, Ur 0.2 0.2 - 1.0 mg/dL   Nitrite, UA  Negative Negative   Microscopic Examination See below:   TSH  Result Value Ref Range   TSH 0.771 0.450 - 4.500 uIU/mL        Current Outpatient Medications:    cyclobenzaprine (FLEXERIL) 5 MG tablet, Take 1 tablet (5 mg total) by mouth at bedtime for 15 days., Disp: 15 tablet, Rfl: 0   gabapentin (NEURONTIN) 100 MG capsule, Take 1 capsule (100 mg total) by mouth at bedtime., Disp: 30 capsule, Rfl: 3    Assessment & Plan:  Bil muscle pain in the lower extremity :  Back pain thoracic and l spine prob needs MRI  Will need to fu with ortho. Slight rightward scoliosis centered in the upper lumbar spine. Diffuse degenerative disc disease and facet disease. No fracture. SI joints symmetric and unremarkable.   IMPRESSION: Degenerative disc and facet disease.  No acute bony abnormality.   To fu with ortho and rheumatology for the above Will send in neurontin for radiculopathy  Flexeril for ms spasms.  Otc ibuprofen    Problem List Items Addressed This Visit       Nervous and Auditory   Bilateral low back pain with sciatica   Relevant Medications   gabapentin (NEURONTIN) 100 MG capsule   cyclobenzaprine (FLEXERIL) 5 MG tablet   Other Relevant Orders   Ambulatory referral to Physical Therapy     Other   Bilateral thoracic back pain - Primary   Relevant Medications   cyclobenzaprine (FLEXERIL) 5 MG tablet   Other Relevant Orders   Ambulatory referral to Physical Therapy   Pain of right lower extremity     Orders Placed This Encounter  Procedures   Ambulatory referral to Physical Therapy     Meds ordered this encounter  Medications   gabapentin (NEURONTIN) 100 MG capsule    Sig: Take 1 capsule (100 mg total) by mouth at bedtime.    Dispense:  30 capsule    Refill:  3   cyclobenzaprine (FLEXERIL) 5 MG tablet    Sig: Take 1 tablet (5 mg total) by mouth at bedtime for 15 days.    Dispense:  15 tablet    Refill:  0     Follow up plan: Return in about 6 weeks  (around 09/10/2021).

## 2021-08-03 DIAGNOSIS — M5416 Radiculopathy, lumbar region: Secondary | ICD-10-CM | POA: Diagnosis not present

## 2021-08-25 DIAGNOSIS — M5136 Other intervertebral disc degeneration, lumbar region: Secondary | ICD-10-CM | POA: Diagnosis not present

## 2021-08-25 DIAGNOSIS — M47817 Spondylosis without myelopathy or radiculopathy, lumbosacral region: Secondary | ICD-10-CM | POA: Diagnosis not present

## 2021-08-25 DIAGNOSIS — M791 Myalgia, unspecified site: Secondary | ICD-10-CM | POA: Diagnosis not present

## 2021-08-25 DIAGNOSIS — M2550 Pain in unspecified joint: Secondary | ICD-10-CM | POA: Diagnosis not present

## 2021-08-31 DIAGNOSIS — M5416 Radiculopathy, lumbar region: Secondary | ICD-10-CM | POA: Diagnosis not present

## 2021-08-31 DIAGNOSIS — M7062 Trochanteric bursitis, left hip: Secondary | ICD-10-CM | POA: Diagnosis not present

## 2021-09-07 ENCOUNTER — Ambulatory Visit: Payer: Medicare HMO | Admitting: Internal Medicine

## 2021-09-21 DIAGNOSIS — M5416 Radiculopathy, lumbar region: Secondary | ICD-10-CM | POA: Diagnosis not present

## 2021-10-12 DIAGNOSIS — M5416 Radiculopathy, lumbar region: Secondary | ICD-10-CM | POA: Diagnosis not present

## 2021-10-12 DIAGNOSIS — M545 Low back pain, unspecified: Secondary | ICD-10-CM | POA: Diagnosis not present

## 2021-10-12 DIAGNOSIS — M25562 Pain in left knee: Secondary | ICD-10-CM | POA: Diagnosis not present

## 2021-11-09 DIAGNOSIS — M5416 Radiculopathy, lumbar region: Secondary | ICD-10-CM | POA: Diagnosis not present

## 2021-11-09 DIAGNOSIS — M1711 Unilateral primary osteoarthritis, right knee: Secondary | ICD-10-CM | POA: Diagnosis not present

## 2022-08-03 ENCOUNTER — Telehealth: Payer: Self-pay | Admitting: Family Medicine

## 2022-08-03 NOTE — Telephone Encounter (Signed)
Copied from [place] [id number]. Topic: Medicare AWV >> Aug 03, 2022  4:33 PM Josephina Gip wrote: Reason for CRM: Left message for patient to call back and schedule the Medicare Annual Wellness Visit (AWV) virtually or by telephone.  Last AWV 07/30/21  Please schedule at anytime with [facility].  30 minute appointment  Any questions, please call me at [phone]

## 2022-09-13 ENCOUNTER — Telehealth: Payer: Self-pay | Admitting: Family Medicine

## 2022-09-13 NOTE — Telephone Encounter (Signed)
Copied from [place] [id number]. Topic: Medicare AWV >> Sep 13, 2022 10:32 AM Devoria Glassing wrote: Reason for CRM: Called patient to schedule Medicare Annual Wellness Visit (AWV). Left message for patient to call back and schedule Medicare Annual Wellness Visit (AWV).  Last date of AWV: 07/30/2021  Please schedule an appointment at any time with Kirke Shaggy, NHA  .  If any questions, please contact me.  Thank you ,  Sherol Dade; [place] Direct Dial: [phone]

## 2023-03-30 IMAGING — DX DG LUMBAR SPINE COMPLETE 4+V
6 series · 6 of 6 positions shown · non-contrast
Comparison: None.

CLINICAL DATA: Low back pain

EXAM:
LUMBAR SPINE - COMPLETE 4+ VIEW

[l-spine ap]
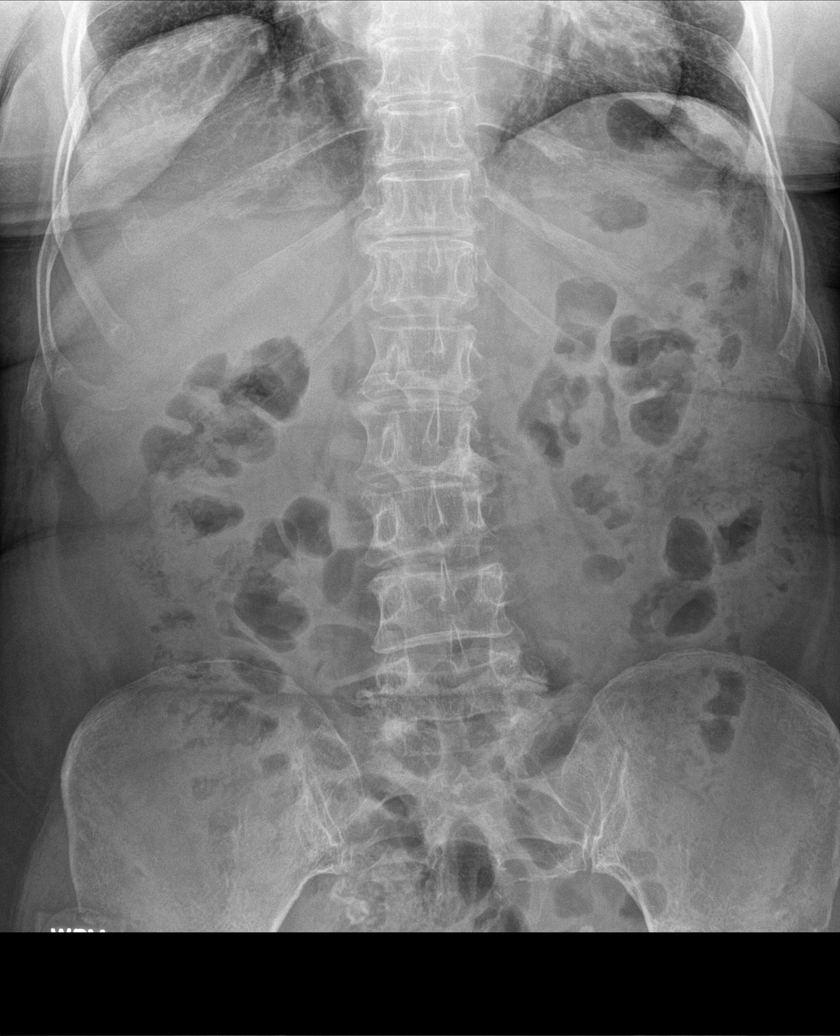

[l-spine obl (1 of 3)]
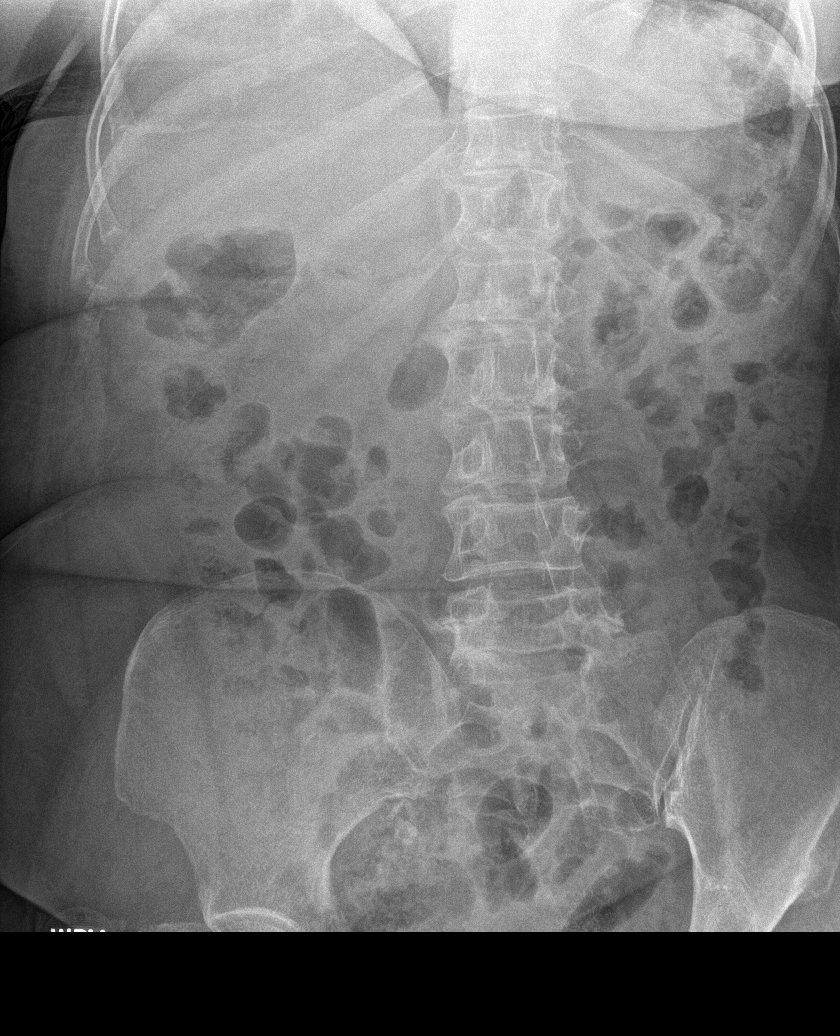

[l-spine obl (2 of 3)]
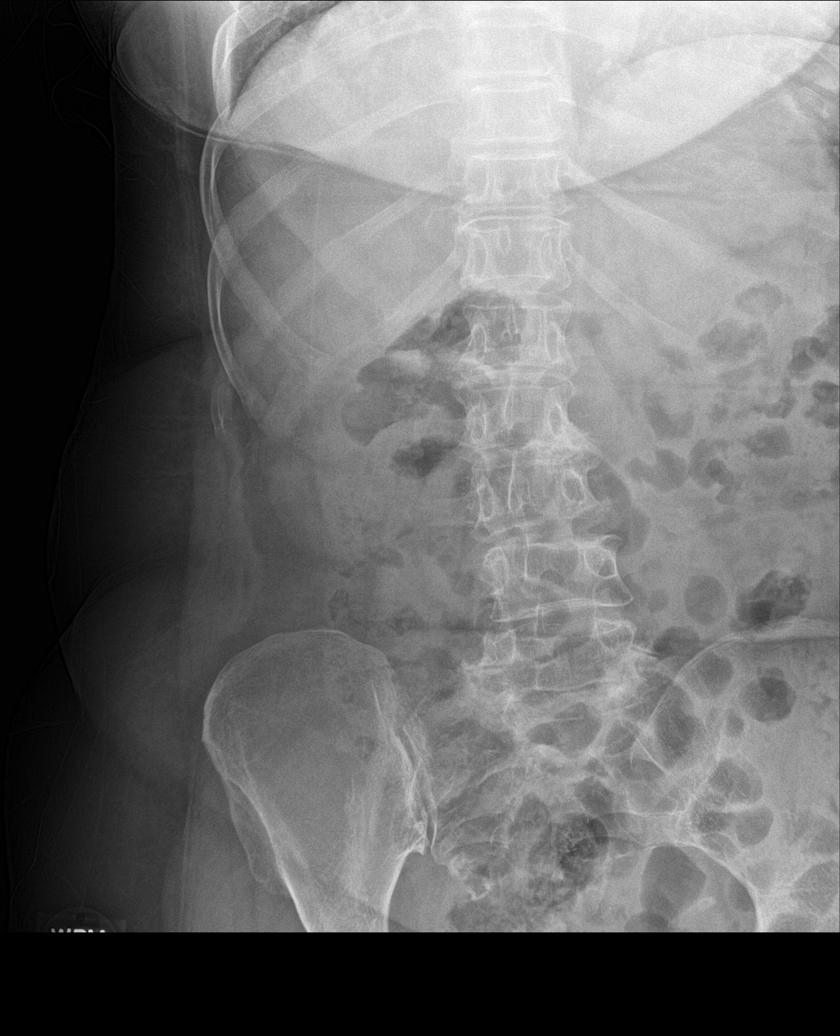

[l-spine lat]
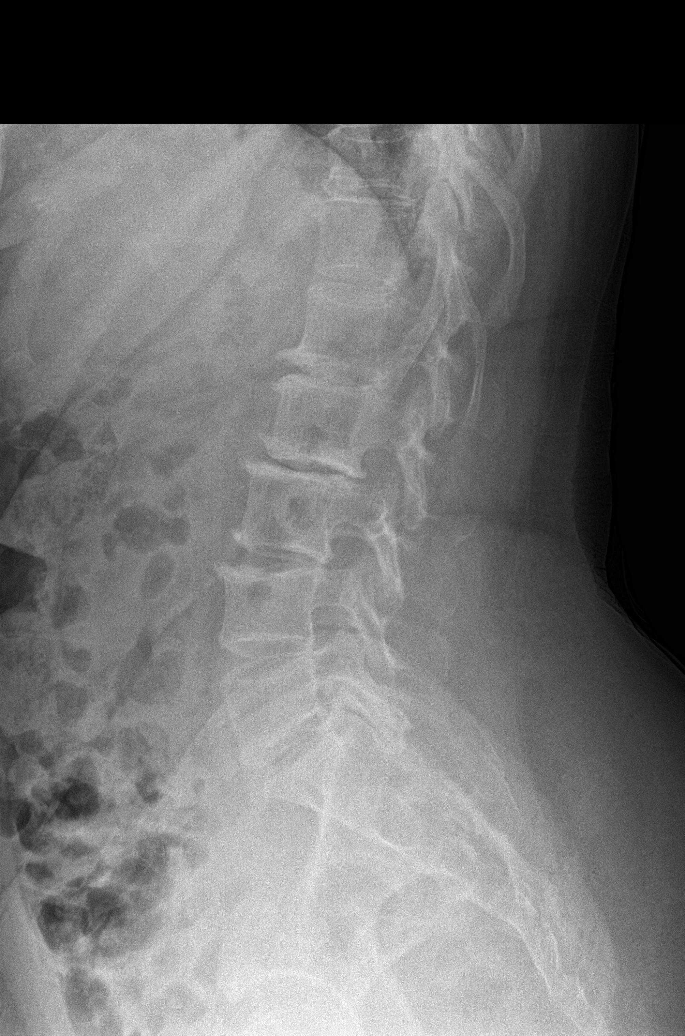

[l-spine spot]
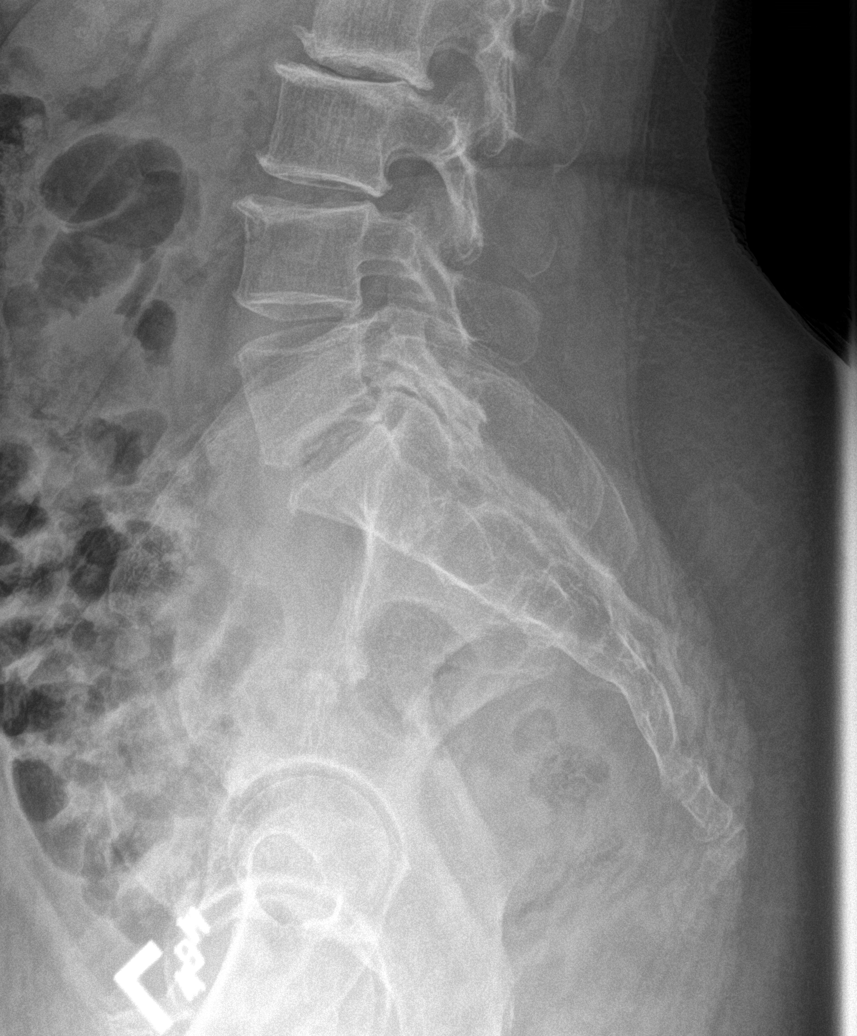

[l-spine obl (3 of 3)]
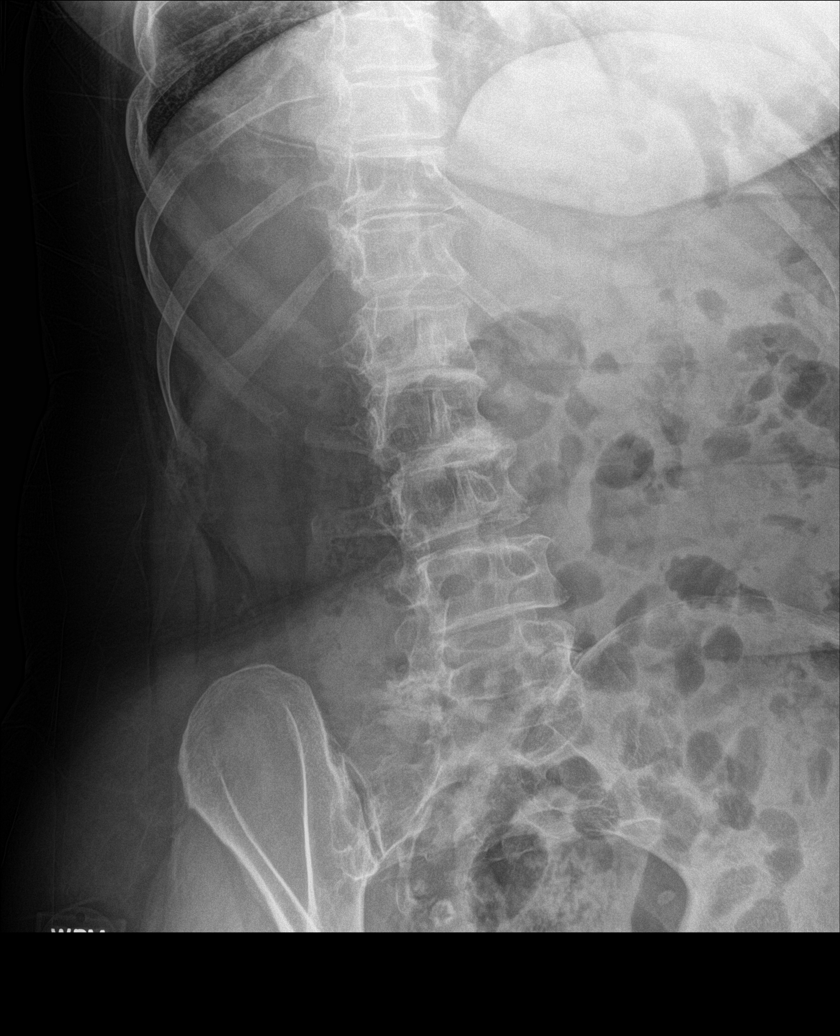

[6 of 6 positions shown; findings below may reference images not displayed]

FINDINGS: Slight rightward scoliosis centered in the upper lumbar spine.
Diffuse degenerative disc disease and facet disease. No fracture. SI
joints symmetric and unremarkable.
IMPRESSION: Degenerative disc and facet disease.  No acute bony abnormality.
